# Patient Record
Sex: Female | Born: 1955 | Race: Black or African American | Hispanic: No | Smoking: Former smoker
Health system: Southern US, Community
[De-identification: ages and names within clinical notes are randomized; demographics above are authoritative.]

## PROBLEM LIST (undated history)

## (undated) DIAGNOSIS — J45909 Unspecified asthma, uncomplicated: Secondary | ICD-10-CM

## (undated) DIAGNOSIS — M1990 Unspecified osteoarthritis, unspecified site: Secondary | ICD-10-CM

## (undated) HISTORY — PX: SHOULDER SURGERY: SHX246

---

## 2016-08-21 ENCOUNTER — Emergency Department (HOSPITAL_COMMUNITY): Payer: Medicaid - Out of State

## 2016-08-21 ENCOUNTER — Emergency Department (HOSPITAL_COMMUNITY)
Admission: EM | Admit: 2016-08-21 | Discharge: 2016-08-21 | Disposition: A | Discharge status: Home / Self Care | Payer: Medicaid - Out of State | Attending: Emergency Medicine | Admitting: Emergency Medicine

## 2016-08-21 ENCOUNTER — Encounter (HOSPITAL_COMMUNITY): Payer: Self-pay | Admitting: Emergency Medicine

## 2016-08-21 DIAGNOSIS — Z87891 Personal history of nicotine dependence: Secondary | ICD-10-CM | POA: Insufficient documentation

## 2016-08-21 DIAGNOSIS — K5900 Constipation, unspecified: Secondary | ICD-10-CM | POA: Insufficient documentation

## 2016-08-21 DIAGNOSIS — J45909 Unspecified asthma, uncomplicated: Secondary | ICD-10-CM | POA: Insufficient documentation

## 2016-08-21 DIAGNOSIS — Z79899 Other long term (current) drug therapy: Secondary | ICD-10-CM | POA: Insufficient documentation

## 2016-08-21 DIAGNOSIS — R1010 Upper abdominal pain, unspecified: Secondary | ICD-10-CM | POA: Diagnosis present

## 2016-08-21 DIAGNOSIS — R1110 Vomiting, unspecified: Secondary | ICD-10-CM | POA: Diagnosis not present

## 2016-08-21 HISTORY — DX: Unspecified asthma, uncomplicated: J45.909

## 2016-08-21 HISTORY — DX: Unspecified osteoarthritis, unspecified site: M19.90

## 2016-08-21 LAB — CBC
HCT: 42.7 % (ref 36.0–46.0)
Hemoglobin: 14.2 g/dL (ref 12.0–15.0)
MCH: 29.6 pg (ref 26.0–34.0)
MCHC: 33.3 g/dL (ref 30.0–36.0)
MCV: 89.1 fL (ref 78.0–100.0)
PLATELETS: 218 10*3/uL (ref 150–400)
RBC: 4.79 MIL/uL (ref 3.87–5.11)
RDW: 13.8 % (ref 11.5–15.5)
WBC: 8.8 10*3/uL (ref 4.0–10.5)

## 2016-08-21 LAB — COMPREHENSIVE METABOLIC PANEL
ALK PHOS: 103 U/L (ref 38–126)
ALT: 30 U/L (ref 14–54)
AST: 54 U/L — ABNORMAL HIGH (ref 15–41)
Albumin: 3.8 g/dL (ref 3.5–5.0)
Anion gap: 8 (ref 5–15)
BUN: 11 mg/dL (ref 6–20)
CALCIUM: 9.3 mg/dL (ref 8.9–10.3)
CO2: 30 mmol/L (ref 22–32)
CREATININE: 0.75 mg/dL (ref 0.44–1.00)
Chloride: 99 mmol/L — ABNORMAL LOW (ref 101–111)
Glucose, Bld: 132 mg/dL — ABNORMAL HIGH (ref 65–99)
Potassium: 3.4 mmol/L — ABNORMAL LOW (ref 3.5–5.1)
Sodium: 137 mmol/L (ref 135–145)
Total Bilirubin: 0.6 mg/dL (ref 0.3–1.2)
Total Protein: 7.7 g/dL (ref 6.5–8.1)

## 2016-08-21 MED ORDER — PEG 3350-KCL-NABCB-NACL-NASULF 236 G PO SOLR: ORAL | 0 refills | Status: AC

## 2016-08-21 MED ORDER — SODIUM CHLORIDE 0.9 % IV BOLUS (SEPSIS)
1000.0000 mL | Freq: Once | INTRAVENOUS | Status: AC
  Administered 2016-08-21: 1000 mL via INTRAVENOUS

## 2016-08-21 MED ORDER — IOPAMIDOL (ISOVUE-300) INJECTION 61%
100.0000 mL | Freq: Once | INTRAVENOUS | Status: AC | PRN
  Administered 2016-08-21: 100 mL via INTRAVENOUS

## 2016-08-21 MED ORDER — IOPAMIDOL (ISOVUE-300) INJECTION 61%
INTRAVENOUS | Status: AC
  Filled 2016-08-21: qty 30

## 2016-08-21 NOTE — ED Provider Notes (Signed)
[facility]-EMERGENCY DEPT Provider Note   CSN: [id number] Arrival date & time: 08/21/16  1045  By signing my name below, I, Modena Jansky, attest that this documentation has been prepared under the direction and in the presence of Bethann Berkshire, MD . Electronically Signed: Modena Jansky, Scribe. 08/21/2016. 12:04 PM.  History   Chief Complaint Chief Complaint  Patient presents with  . Abdominal Pain   The history is provided by the patient. No language interpreter was used.  Abdominal Pain   This is a new problem. The current episode started more than 1 week ago. The problem occurs constantly. The problem has not changed since onset.The pain is located in the epigastric region. The pain is moderate. Associated symptoms include vomiting and constipation. Pertinent negatives include diarrhea, frequency, hematuria and headaches. Nothing aggravates the symptoms. Nothing relieves the symptoms.   HPI Comments: Eileen Reed is a 61 y.o. female who presents to the Emergency Department complaining of constant moderate upper abdominal pain that started a few days ago. She states she was seen at the [facility] ED two days ago. She reports 4 episodes of associated vomiting. She describes the emesis as black colored. She has associated constipation (last BM was 3 weeks ago). No modifying factors. She denies any other complaints.   Past Medical History:  Diagnosis Date  . Arthritis   . Asthma     There are no active problems to display for this patient.   Past Surgical History:  Procedure Laterality Date  . SHOULDER SURGERY Right     OB History    No data available       Home Medications    Prior to Admission medications   Not on File    Family History No family history on file.  Social History Social History  Substance Use Topics  . Smoking status: Former Games developer  . Smokeless tobacco: Never Used  . Alcohol use No     Allergies   Morphine and related and Valium  [diazepam]   Review of Systems Review of Systems  Constitutional: Negative for appetite change and fatigue.  HENT: Negative for congestion, ear discharge and sinus pressure.   Eyes: Negative for discharge.  Respiratory: Negative for cough.   Cardiovascular: Negative for chest pain.  Gastrointestinal: Positive for abdominal pain, constipation and vomiting. Negative for diarrhea.  Genitourinary: Negative for frequency and hematuria.  Musculoskeletal: Negative for back pain.  Skin: Negative for rash.  Neurological: Negative for seizures and headaches.  Psychiatric/Behavioral: Negative for hallucinations.    Physical Exam Updated Vital Signs BP 141/88 (BP Location: Left Arm)   Pulse 108   Temp 98.2 F (36.8 C) (Oral)   Resp 18   Ht 5\' 2"  (1.575 m)   Wt 229 lb (103.9 kg)   SpO2 98%   BMI 41.88 kg/m   Physical Exam  Constitutional: She is oriented to person, place, and time. She appears well-developed.  HENT:  Head: Normocephalic.  Eyes: Conjunctivae and EOM are normal. No scleral icterus.  Neck: Neck supple. No thyromegaly present.  Cardiovascular: Regular rhythm.  Tachycardia present.  Exam reveals no gallop and no friction rub.   No murmur heard. Pulmonary/Chest: No stridor. She has no wheezes. She has no rales. She exhibits no tenderness.  Abdominal: She exhibits distension. There is tenderness. There is no rebound.  Moderately TTP and distended all throughout abdomen.   Musculoskeletal: Normal range of motion. She exhibits no edema.  Lymphadenopathy:    She has no cervical adenopathy.  Neurological:  She is oriented to person, place, and time. She exhibits normal muscle tone. Coordination normal.  Skin: No rash noted. No erythema.  Psychiatric: She has a normal mood and affect. Her behavior is normal.  Nursing note and vitals reviewed.    ED Treatments / Results  DIAGNOSTIC STUDIES: Oxygen Saturation is 98% on RA, normal by my interpretation.    COORDINATION OF  CARE: 12:09 PM- Pt advised of plan for treatment and pt agrees.  Labs (all labs ordered are listed, but only abnormal results are displayed) Labs Reviewed  COMPREHENSIVE METABOLIC PANEL - Abnormal; Notable for the following:       Result Value   Potassium 3.4 (*)    Chloride 99 (*)    Glucose, Bld 132 (*)    AST 54 (*)    All other components within normal limits  CBC    EKG  EKG Interpretation None       Radiology No results found.  Procedures Procedures (including critical care time)  Medications Ordered in ED Medications  sodium chloride 0.9 % bolus 1,000 mL (not administered)     Initial Impression / Assessment and Plan / ED Course  I have reviewed the triage vital signs and the nursing notes.  Pertinent labs & imaging results that were available during my care of the patient were reviewed by me and considered in my medical decision making (see chart for details).  Clinical Course     Patient with constipation. She is sent home with GoLYTELY and will follow-up with family doctor  Final Clinical Impressions(s) / ED Diagnoses   Final diagnoses:  None    New Prescriptions New Prescriptions   No medications on file   The chart was scribed for me under my direct supervision.  I personally performed the history, physical, and medical decision making and all procedures in the evaluation of this patient.Bethann Berkshire.     Natan Hartog, MD 08/21/16 1524

## 2016-08-21 NOTE — ED Notes (Signed)
Pt returned from xray

## 2016-08-21 NOTE — Discharge Instructions (Signed)
Drink plenty of fluids. Follow-up with a family doctor in one week

## 2016-08-21 NOTE — ED Triage Notes (Signed)
Patient states that she was seen at [facility] Sunday night. States constipation and epigastric pain. States [facility] diagnosed her with low potassium and oral magnesium citrate with no bowl movement.  States last BM was 3 weeks ago.

## 2016-08-21 NOTE — ED Notes (Signed)
Pt takne to ct 

## 2018-01-10 ENCOUNTER — Encounter (HOSPITAL_COMMUNITY): Payer: Self-pay | Admitting: Emergency Medicine

## 2018-01-10 ENCOUNTER — Emergency Department (HOSPITAL_COMMUNITY)
Admission: EM | Admit: 2018-01-10 | Discharge: 2018-01-10 | Disposition: A | Discharge status: Home / Self Care | Payer: Medicaid - Out of State | Attending: Emergency Medicine | Admitting: Emergency Medicine

## 2018-01-10 ENCOUNTER — Other Ambulatory Visit: Payer: Self-pay

## 2018-01-10 ENCOUNTER — Emergency Department (HOSPITAL_COMMUNITY): Payer: Medicaid - Out of State

## 2018-01-10 DIAGNOSIS — L03116 Cellulitis of left lower limb: Secondary | ICD-10-CM | POA: Diagnosis not present

## 2018-01-10 DIAGNOSIS — E876 Hypokalemia: Secondary | ICD-10-CM | POA: Diagnosis not present

## 2018-01-10 DIAGNOSIS — Z79899 Other long term (current) drug therapy: Secondary | ICD-10-CM | POA: Diagnosis not present

## 2018-01-10 DIAGNOSIS — Z87891 Personal history of nicotine dependence: Secondary | ICD-10-CM | POA: Diagnosis not present

## 2018-01-10 DIAGNOSIS — J45909 Unspecified asthma, uncomplicated: Secondary | ICD-10-CM | POA: Diagnosis not present

## 2018-01-10 DIAGNOSIS — R2242 Localized swelling, mass and lump, left lower limb: Secondary | ICD-10-CM | POA: Diagnosis present

## 2018-01-10 LAB — CBC WITH DIFFERENTIAL/PLATELET
BASOS ABS: 0 10*3/uL (ref 0.0–0.1)
Basophils Relative: 0 %
EOS ABS: 0.1 10*3/uL (ref 0.0–0.7)
EOS PCT: 2 %
HCT: 43 % (ref 36.0–46.0)
HEMOGLOBIN: 14.2 g/dL (ref 12.0–15.0)
LYMPHS PCT: 49 %
Lymphs Abs: 3 10*3/uL (ref 0.7–4.0)
MCH: 29.3 pg (ref 26.0–34.0)
MCHC: 33 g/dL (ref 30.0–36.0)
MCV: 88.8 fL (ref 78.0–100.0)
Monocytes Absolute: 0.5 10*3/uL (ref 0.1–1.0)
Monocytes Relative: 7 %
NEUTROS PCT: 42 %
Neutro Abs: 2.6 10*3/uL (ref 1.7–7.7)
PLATELETS: 193 10*3/uL (ref 150–400)
RBC: 4.84 MIL/uL (ref 3.87–5.11)
RDW: 13.1 % (ref 11.5–15.5)
WBC: 6.2 10*3/uL (ref 4.0–10.5)

## 2018-01-10 LAB — BASIC METABOLIC PANEL
ANION GAP: 9 (ref 5–15)
BUN: 13 mg/dL (ref 6–20)
CHLORIDE: 102 mmol/L (ref 101–111)
CO2: 27 mmol/L (ref 22–32)
Calcium: 9.8 mg/dL (ref 8.9–10.3)
Creatinine, Ser: 0.69 mg/dL (ref 0.44–1.00)
GFR calc Af Amer: >60 mL/min (ref >60)
Glucose, Bld: 98 mg/dL (ref 65–99)
POTASSIUM: 2.9 mmol/L — AB (ref 3.5–5.1)
SODIUM: 138 mmol/L (ref 135–145)

## 2018-01-10 MED ORDER — SULFAMETHOXAZOLE-TRIMETHOPRIM 800-160 MG PO TABS: 1.0000 | ORAL_TABLET | Freq: Two times a day (BID) | ORAL | 0 refills | Status: AC

## 2018-01-10 MED ORDER — TRAMADOL HCL 50 MG PO TABS: 50.0000 mg | ORAL_TABLET | Freq: Four times a day (QID) | ORAL | 0 refills | Status: AC | PRN

## 2018-01-10 MED ORDER — SULFAMETHOXAZOLE-TRIMETHOPRIM 800-160 MG PO TABS
1.0000 | ORAL_TABLET | Freq: Once | ORAL | Status: AC
  Administered 2018-01-10: 1 via ORAL
  Filled 2018-01-10: qty 1

## 2018-01-10 MED ORDER — POTASSIUM CHLORIDE CRYS ER 20 MEQ PO TBCR
40.0000 meq | EXTENDED_RELEASE_TABLET | Freq: Once | ORAL | Status: AC
  Administered 2018-01-10: 40 meq via ORAL
  Filled 2018-01-10: qty 2

## 2018-01-10 MED ORDER — TRAMADOL HCL 50 MG PO TABS
50.0000 mg | ORAL_TABLET | Freq: Once | ORAL | Status: DC
  Filled 2018-01-10: qty 1

## 2018-01-10 MED ORDER — POTASSIUM CHLORIDE CRYS ER 20 MEQ PO TBCR: 20.0000 meq | EXTENDED_RELEASE_TABLET | Freq: Two times a day (BID) | ORAL | 0 refills | Status: AC

## 2018-01-10 NOTE — ED Triage Notes (Addendum)
Patient complaining of left lower leg and left foot swelling x 5 days. Patient has scratch noted to area of swelling.

## 2018-01-10 NOTE — Discharge Instructions (Signed)
I suspect you may have an early skin infection which is causing your pain.  Take the antibiotic as prescribed.  Elevate your leg as much as possible, using a warm compress on the leg can also help heal this quicker. As discussed, your potassium is low today at 2.9 and you will need to be on a potassium supplement for the next week to bring it up to a normal range.  Your intake of bananas and leafy green vegetables is healthy but is probably not enough to replace the amount you are loosing (probably from your fluid pill).  Get rechecked prior to Wednesday if your leg pain/swelling is worsening despite the antibiotic treatment.

## 2018-01-10 NOTE — ED Provider Notes (Signed)
[facility] EMERGENCY DEPARTMENT Provider Note   CSN: [id number] Arrival date & time: 01/10/18  1313     History   Chief Complaint Chief Complaint  Patient presents with  . Leg Swelling    HPI Eileen Reed is a 62 y.o. female presenting with swelling along her left lower lateral leg which started 5 days ago.  She was at an outdoor event the day before when she felt she was bit by something and developed itching at the site which she has been scratching, now has increased swelling and constant pain which radiates into her ankle.  She denies injury, such as twists or fall and has had no fevers, chills, no drainage from the site.  She has an abrasion on her leg which is old and healing, almost gone which she does not feel is related to her current symptoms.  She has taken ibuprofen and used elevation which helps transiently with symptoms.  The history is provided by the patient.    Past Medical History:  Diagnosis Date  . Arthritis   . Asthma     There are no active problems to display for this patient.   Past Surgical History:  Procedure Laterality Date  . SHOULDER SURGERY Right      OB History   None      Home Medications    Prior to Admission medications   Medication Sig Start Date End Date Taking? Authorizing Provider  albuterol (PROVENTIL HFA;VENTOLIN HFA) 108 (90 Base) MCG/ACT inhaler Inhale 1-2 puffs into the lungs every 6 (six) hours as needed for wheezing or shortness of breath.    [provider]  albuterol (PROVENTIL) (2.5 MG/3ML) 0.083% nebulizer solution Take 2.5 mg by nebulization every 6 (six) hours as needed for wheezing or shortness of breath.    [provider]  cyclobenzaprine (FLEXERIL) 10 MG tablet Take 10 mg by mouth at bedtime.    [provider]  hydrochlorothiazide (HYDRODIURIL) 25 MG tablet Take 25 mg by mouth daily.    [provider]  ibuprofen (ADVIL,MOTRIN) 800 MG tablet Take 800 mg by mouth every 8  (eight) hours as needed.    [provider]  montelukast (SINGULAIR) 10 MG tablet Take 10 mg by mouth daily.     [provider]  polyethylene glycol (GOLYTELY) 236 g solution Drink one 8 ounce glass every hour until you have a good bowel movement 08/21/16   Bethann Berkshire, MD  polyethylene glycol (GOLYTELY) 236 g solution Drink one 8 ounce glass every hour until you have a bowel movement 08/21/16   Bethann Berkshire, MD  potassium chloride SA (K-DUR,KLOR-CON) 20 MEQ tablet Take 1 tablet (20 mEq total) by mouth 2 (two) times daily. 01/10/18   Burgess Amor, PA-C  sulfamethoxazole-trimethoprim (BACTRIM DS,SEPTRA DS) 800-160 MG tablet Take 1 tablet by mouth 2 (two) times daily for 10 days. 01/10/18 01/20/18  Burgess Amor, PA-C  theophylline (THEODUR) 300 MG 12 hr tablet Take 300 mg by mouth daily.    [provider]  traMADol (ULTRAM) 50 MG tablet Take 1 tablet (50 mg total) by mouth every 6 (six) hours as needed. 01/10/18   Burgess Amor, PA-C    Family History History reviewed. No pertinent family history.  Social History Social History   Tobacco Use  . Smoking status: Former Games developer  . Smokeless tobacco: Never Used  Substance Use Topics  . Alcohol use: No  . Drug use: No     Allergies   Morphine and related  and Valium [diazepam]   Review of Systems Review of Systems  Constitutional: Negative for chills and fever.  Respiratory: Negative for shortness of breath and wheezing.   Skin: Positive for color change and wound. Negative for rash.  Neurological: Negative for weakness and numbness.     Physical Exam Updated Vital Signs BP (!) 144/96 (BP Location: Right Arm)   Pulse (!) 101   Temp 98.3 F (36.8 C) (Oral)   Resp 15   Ht  (1.6 m)   Wt 104.8 kg (231 lb)   SpO2 97%   BMI 40.92 kg/m   Physical Exam  Constitutional: She appears well-developed and well-nourished.  HENT:  Head: Atraumatic.  Neck: Normal range of motion.  Cardiovascular:  Pulses:       Dorsalis pedis pulses are 2+ on the right side, and 2+ on the left side.  Pulses equal bilaterally  Musculoskeletal: She exhibits edema and tenderness.  Neurological: She is alert. She has normal strength. She displays normal reflexes. No sensory deficit.  Skin: Skin is warm and dry. There is erythema.  Very mild increased warmth at her lateral lower leg.  No induration, no fluctuance.  Well healed abrasion near this site but no sign of infection around the abrasion itself.  Nearly resolved scab.   Psychiatric: She has a normal mood and affect.     ED Treatments / Results  Labs (all labs ordered are listed, but only abnormal results are displayed) Labs Reviewed  BASIC METABOLIC PANEL - Abnormal; Notable for the following components:      Result Value   Potassium 2.9 (*)    All other components within normal limits  CBC WITH DIFFERENTIAL/PLATELET    EKG None  Radiology Dg Ankle Complete Left  Result Date: 01/10/2018 CLINICAL DATA:  Left lower leg and foot swelling for the past 5 days. EXAM: LEFT ANKLE COMPLETE - 3+ VIEW COMPARISON:  None. FINDINGS: No acute fracture or dislocation. The ankle mortise is symmetric. The talar dome is intact. No tibiotalar joint effusion. Joint spaces are preserved. Mild osteopenia. Bimalleolar soft tissue swelling. IMPRESSION: 1. Bimalleolar soft tissue swelling.  No acute osseous abnormality. Electronically Signed   By: Obie Dredge M.D.   On: 01/10/2018 15:52    Procedures Procedures (including critical care time)  Medications Ordered in ED Medications  traMADol (ULTRAM) tablet 50 mg (50 mg Oral Not Given 01/10/18 1630)  sulfamethoxazole-trimethoprim (BACTRIM DS,SEPTRA DS) 800-160 MG per tablet 1 tablet (1 tablet Oral Given 01/10/18 1617)  potassium chloride SA (K-DUR,KLOR-CON) CR tablet 40 mEq (40 mEq Oral Given 01/10/18 1617)     Initial Impression / Assessment and Plan / ED Course  I have reviewed the triage vital signs and the nursing  notes.  Pertinent labs & imaging results that were available during my care of the patient were reviewed by me and considered in my medical decision making (see chart for details).     Pt with suspected early cellulitis, probably from insect bite/ scratching. Bedside US performed with no abscess noted.  She was placed on bactrim here. Potassium also is low today - she states it is frequently low and has been lower than 2.9, pcp aware and has advised dietary management. Pt is on hctz, she was given PO dosing here with script for same.  Discussed elevation, heat. Tramadol prescribed for pain relief.  She has f/u with pcp in 5 days. Advised f/u sooner for any worsened sx.   Final Clinical Impressions(s) / ED Diagnoses  Final diagnoses:  Cellulitis of left lower extremity  Hypokalemia due to loss of potassium    ED Discharge Orders        Ordered    sulfamethoxazole-trimethoprim (BACTRIM DS,SEPTRA DS) 800-160 MG tablet  2 times daily     01/10/18 1624    potassium chloride SA (K-DUR,KLOR-CON) 20 MEQ tablet  2 times daily     01/10/18 1624    traMADol (ULTRAM) 50 MG tablet  Every 6 hours PRN     01/10/18 1627       Burgess Amor, PA-C 01/10/18 1735    Samuel Jester, DO 01/13/18 [phone]

## 2018-01-10 NOTE — ED Notes (Signed)
Pt back from x-ray.

## 2019-10-10 IMAGING — DX DG ANKLE COMPLETE 3+V*L*
3 series · 3 of 3 positions shown · non-contrast
Comparison: None.

CLINICAL DATA: Left lower leg and foot swelling for the past 5
days.

EXAM:
LEFT ANKLE COMPLETE - 3+ VIEW

[ankle ap]
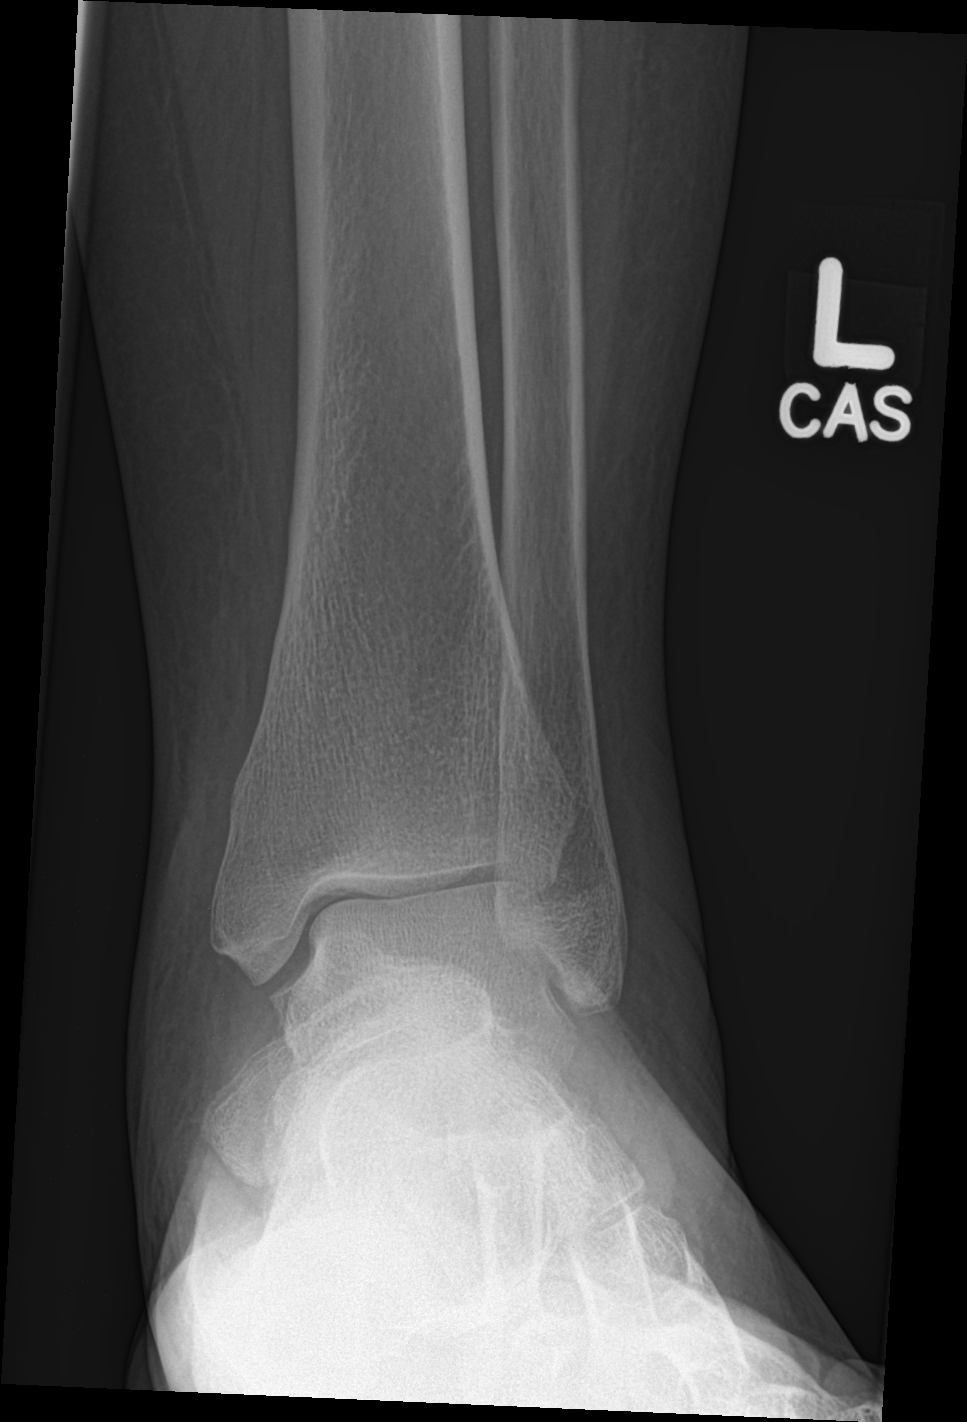

[ankle obl]
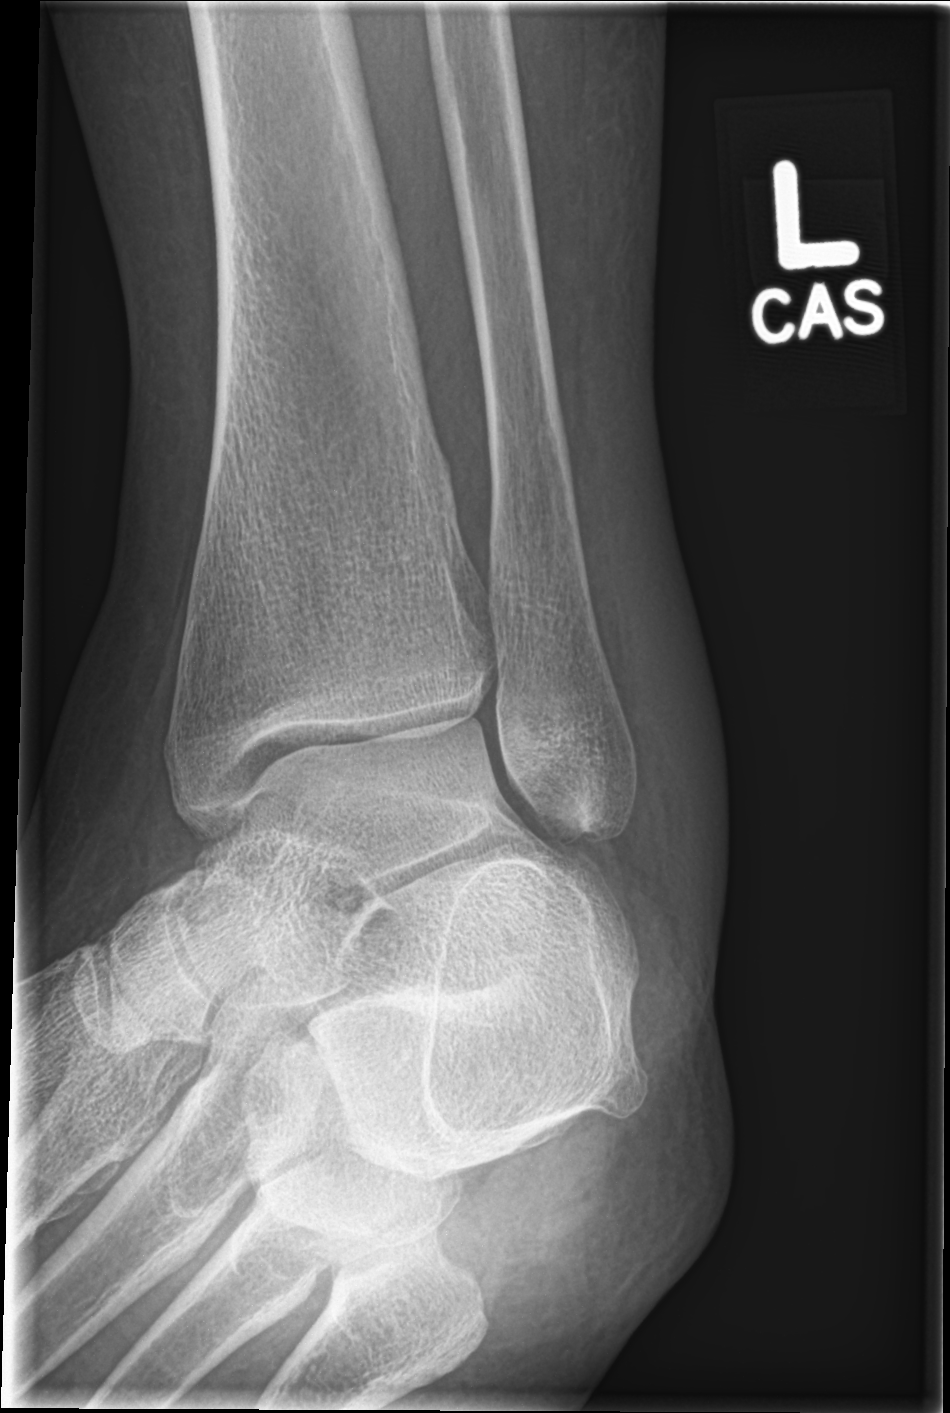

[ankle lat]
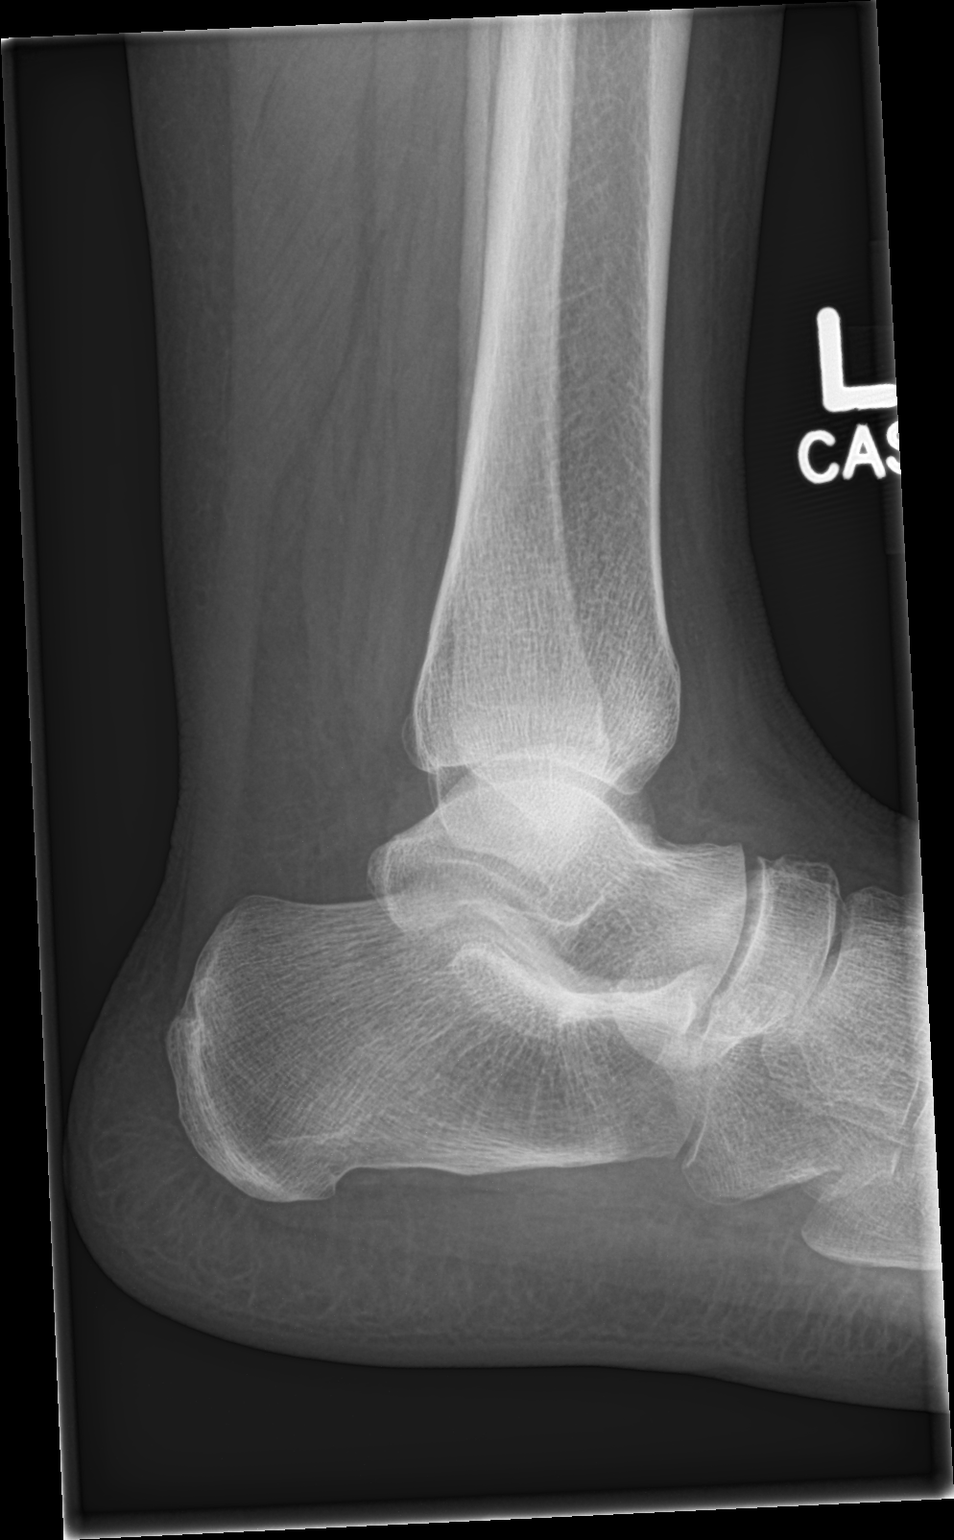

[3 of 3 positions shown; findings below may reference images not displayed]

FINDINGS: No acute fracture or dislocation. The ankle mortise is symmetric.
The talar dome is intact. No tibiotalar joint effusion. Joint spaces
are preserved. Mild osteopenia. Bimalleolar soft tissue swelling.
IMPRESSION: 1. Bimalleolar soft tissue swelling.  No acute osseous abnormality.
